# Patient Record
Sex: Female | Born: 1995
Health system: Southern US, Community
[De-identification: ages and names within clinical notes are randomized; demographics above are authoritative.]

---
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
## 2013-05-27 ENCOUNTER — Encounter (HOSPITAL_BASED_OUTPATIENT_CLINIC_OR_DEPARTMENT_OTHER): Payer: Self-pay | Admitting: Emergency Medicine

## 2013-05-27 DIAGNOSIS — J159 Unspecified bacterial pneumonia: Secondary | ICD-10-CM | POA: Insufficient documentation

## 2013-05-27 NOTE — ED Notes (Signed)
Pt c/o URI symptoms x 3 weeks 

## 2013-05-28 ENCOUNTER — Emergency Department (HOSPITAL_BASED_OUTPATIENT_CLINIC_OR_DEPARTMENT_OTHER): Payer: BC Managed Care – PPO

## 2013-05-28 ENCOUNTER — Emergency Department (HOSPITAL_BASED_OUTPATIENT_CLINIC_OR_DEPARTMENT_OTHER)
Admission: EM | Admit: 2013-05-28 | Discharge: 2013-05-28 | Disposition: A | Discharge status: Home / Self Care | Payer: BC Managed Care – PPO | Attending: Emergency Medicine | Admitting: Emergency Medicine

## 2013-05-28 DIAGNOSIS — J189 Pneumonia, unspecified organism: Secondary | ICD-10-CM

## 2013-05-28 MED ORDER — AMOXICILLIN-POT CLAVULANATE 875-125 MG PO TABS: 1.0000 | ORAL_TABLET | Freq: Two times a day (BID) | ORAL | Status: DC

## 2013-05-28 MED ORDER — ALBUTEROL SULFATE HFA 108 (90 BASE) MCG/ACT IN AERS: 1.0000 | INHALATION_SPRAY | Freq: Four times a day (QID) | RESPIRATORY_TRACT | Status: DC | PRN

## 2013-05-28 MED ORDER — GUAIFENESIN ER 600 MG PO TB12: 600.0000 mg | ORAL_TABLET | Freq: Two times a day (BID) | ORAL | Status: DC

## 2013-05-28 MED ORDER — AMOXICILLIN-POT CLAVULANATE 875-125 MG PO TABS
1.0000 | ORAL_TABLET | Freq: Once | ORAL | Status: AC
  Administered 2013-05-28: 1 via ORAL
  Filled 2013-05-28: qty 1

## 2013-05-28 MED ORDER — IBUPROFEN 800 MG PO TABS
800.0000 mg | ORAL_TABLET | Freq: Once | ORAL | Status: AC
  Administered 2013-05-28: 800 mg via ORAL
  Filled 2013-05-28: qty 1

## 2013-05-28 NOTE — ED Notes (Signed)
Runny nose x 3 weeks  Denies fever

## 2013-05-28 NOTE — ED Notes (Signed)
C/o cough,congestion runny nose  Fever up to 100

## 2013-05-28 NOTE — ED Provider Notes (Signed)
CSN: [id number]     Arrival date & time 05/27/13  2304 History   First MD Initiated Contact with Patient 05/28/13 0119     Chief Complaint  Patient presents with  . URI   (Consider location/radiation/quality/duration/timing/severity/associated sxs/prior Treatment) Patient is a 17 y.o. female presenting with URI. The history is provided by the patient.  URI Presenting symptoms: congestion and cough   Presenting symptoms: no fever and no sore throat   Congestion:    Location:  Chest   Interferes with sleep: no     Interferes with eating/drinking: no   Cough:    Cough characteristics:  Non-productive   Severity:  Moderate   Onset quality:  Gradual   Timing:  Intermittent   Progression:  Unchanged   Chronicity:  Recurrent Severity:  Mild Onset quality:  Gradual Timing:  Intermittent Progression:  Unchanged Chronicity:  Recurrent Relieved by:  Nothing Worsened by:  Nothing tried Ineffective treatments: zpak and inhaler which she forgot in [place]. Associated symptoms: no arthralgias, no myalgias and no neck pain   Risk factors: not elderly     History reviewed. No pertinent past medical history. History reviewed. No pertinent past surgical history. History reviewed. No pertinent family history. History  Substance Use Topics  . Smoking status: Never Smoker   . Smokeless tobacco: Not on file  . Alcohol Use: No   OB History   Grav Para Term Preterm Abortions TAB SAB Ect Mult Living                 Review of Systems  Constitutional: Negative for fever.  HENT: Positive for congestion. Negative for ear discharge, facial swelling, sore throat, trouble swallowing and voice change.   Respiratory: Positive for cough.   Musculoskeletal: Negative for arthralgias, myalgias and neck pain.  All other systems reviewed and are negative.    Allergies  Review of patient's allergies indicates not on file.  Home Medications  No current outpatient prescriptions on file. BP  121/77  Pulse 100  Temp(Src) 99.1 F (37.3 C) (Oral)  Resp 16  Ht 5\' 5"  (1.651 m)  Wt 150 lb (68.04 kg)  BMI 24.96 kg/m2  SpO2 100%  LMP 04/29/2013 Physical Exam  Constitutional: She is oriented to person, place, and time. She appears well-developed and well-nourished. No distress.  HENT:  Head: Normocephalic and atraumatic.  Mouth/Throat: Oropharynx is clear and moist.  Eyes: Conjunctivae are normal. Pupils are equal, round, and reactive to light.  Neck: Normal range of motion. Neck supple.  Cardiovascular: Normal rate, regular rhythm and intact distal pulses.   Pulmonary/Chest: Effort normal and breath sounds normal. No respiratory distress. She has no wheezes. She has no rales.  Abdominal: Soft. Bowel sounds are normal. There is no tenderness.  Musculoskeletal: Normal range of motion.  Neurological: She is alert and oriented to person, place, and time.  Skin: Skin is warm and dry.  Psychiatric: She has a normal mood and affect.    ED Course  Procedures (including critical care time) Labs Review Labs Reviewed - No data to display Imaging Review No results found.  EKG Interpretation   None       MDM  No diagnosis found. Will treat for pneumonia with augmentin.  Will write RX for inhaler as patient forgot hers.  And prescribe mucinex.  Tylenol and motrin for fever.  Follow up with your PMD for ongoing care.      Jasmine Awe, MD 05/28/13 [phone]

## 2020-05-21 ENCOUNTER — Other Ambulatory Visit (HOSPITAL_BASED_OUTPATIENT_CLINIC_OR_DEPARTMENT_OTHER): Payer: Self-pay | Admitting: Emergency Medicine

## 2020-05-21 ENCOUNTER — Encounter (HOSPITAL_BASED_OUTPATIENT_CLINIC_OR_DEPARTMENT_OTHER): Payer: Self-pay | Admitting: *Deleted

## 2020-05-21 ENCOUNTER — Other Ambulatory Visit: Payer: Self-pay

## 2020-05-21 ENCOUNTER — Emergency Department (HOSPITAL_BASED_OUTPATIENT_CLINIC_OR_DEPARTMENT_OTHER)
Admission: EM | Admit: 2020-05-21 | Discharge: 2020-05-21 | Disposition: A | Discharge status: Home / Self Care | Attending: Emergency Medicine | Admitting: Emergency Medicine

## 2020-05-21 ENCOUNTER — Emergency Department (HOSPITAL_BASED_OUTPATIENT_CLINIC_OR_DEPARTMENT_OTHER)

## 2020-05-21 DIAGNOSIS — Z20822 Contact with and (suspected) exposure to covid-19: Secondary | ICD-10-CM | POA: Insufficient documentation

## 2020-05-21 DIAGNOSIS — J209 Acute bronchitis, unspecified: Secondary | ICD-10-CM | POA: Diagnosis not present

## 2020-05-21 DIAGNOSIS — R0602 Shortness of breath: Secondary | ICD-10-CM | POA: Diagnosis present

## 2020-05-21 LAB — RESP PANEL BY RT-PCR (FLU A&B, COVID) ARPGX2
Influenza A by PCR: NEGATIVE
Influenza B by PCR: NEGATIVE
SARS Coronavirus 2 by RT PCR: NEGATIVE

## 2020-05-21 MED ORDER — AZITHROMYCIN 250 MG PO TABS: 250.0000 mg | ORAL_TABLET | Freq: Every day | ORAL | 0 refills | Status: DC

## 2020-05-21 MED ORDER — ALBUTEROL SULFATE HFA 108 (90 BASE) MCG/ACT IN AERS
2.0000 | INHALATION_SPRAY | Freq: Once | RESPIRATORY_TRACT | Status: AC
  Administered 2020-05-21: 10:00:00 2 via RESPIRATORY_TRACT
  Filled 2020-05-21: qty 6.7

## 2020-05-21 MED FILL — AZITHROMYCIN 250 MG TABLET: 250 | 5 days supply | Qty: 6 | Fill #0

## 2020-05-21 NOTE — ED Notes (Signed)
ED Provider at bedside. 

## 2020-05-21 NOTE — ED Provider Notes (Signed)
[facility] EMERGENCY DEPARTMENT Provider Note   CSN: [id number] Arrival date & time: 05/21/20  4401     History Chief Complaint  Patient presents with  . Chest Pain    Samantha Johnston is a 24 y.o. female.  She is here with a complaint of some tightness in her chest and shortness of breath along with a dry cough that started last night.  No fevers.  Nonproductive cough.  She thinks it is related to going outside in the cold old without a jacket.  No history of asthma.  Non-smoker.  She is Covid vaccinated.  She said she felt like this once before when she had pneumonia.  No leg swelling.  No recent travel or other PE risk factors.  Last menstrual period 3 weeks ago  The history is provided by the patient.  Chest Pain Pain location:  L chest and R chest Pain quality: tightness   Pain radiates to:  Does not radiate Pain severity:  Mild Onset quality:  Gradual Duration:  12 hours Progression:  Resolved Chronicity:  New Relieved by:  None tried Worsened by:  Nothing Ineffective treatments:  None tried Associated symptoms: cough and shortness of breath   Associated symptoms: no abdominal pain, no back pain, no diaphoresis, no fever, no headache, no lower extremity edema, no nausea and no vomiting   Risk factors: no prior DVT/PE and no smoking        History reviewed. No pertinent past medical history.  There are no problems to display for this patient.   History reviewed. No pertinent surgical history.   OB History    Gravida  2   Para  1   Term      Preterm      AB  1   Living        SAB      IAB      Ectopic      Multiple      Live Births              History reviewed. No pertinent family history.  Social History   Tobacco Use  . Smoking status: Never Smoker  . Smokeless tobacco: Never Used  Substance Use Topics  . Alcohol use: Yes    Comment: occasionally  . Drug use: No    Home Medications Prior to Admission medications    Not on File    Allergies    Patient has no known allergies.  Review of Systems   Review of Systems  Constitutional: Negative for diaphoresis and fever.  HENT: Negative for sore throat.   Eyes: Negative for visual disturbance.  Respiratory: Positive for cough and shortness of breath.   Cardiovascular: Positive for chest pain.  Gastrointestinal: Negative for abdominal pain, nausea and vomiting.  Genitourinary: Negative for dysuria.  Musculoskeletal: Negative for back pain.  Skin: Negative for rash.  Neurological: Negative for headaches.    Physical Exam Updated Vital Signs BP 124/74 (BP Location: Right Arm)   Pulse (!) 111   Temp 99.4 F (37.4 C) (Oral)   Resp (!) 21   Ht 5\' 3"  (1.6 m)   Wt 88 kg   LMP 04/17/2020   SpO2 99%   BMI 34.37 kg/m   Physical Exam Vitals and nursing note reviewed.  Constitutional:      General: She is not in acute distress.    Appearance: She is well-developed and well-nourished.  HENT:     Head: Normocephalic and atraumatic.  Eyes:     Conjunctiva/sclera: Conjunctivae normal.  Cardiovascular:     Rate and Rhythm: Normal rate and regular rhythm.     Heart sounds: Normal heart sounds. No murmur heard.   Pulmonary:     Effort: Pulmonary effort is normal. No respiratory distress.     Breath sounds: Normal breath sounds.  Abdominal:     Palpations: Abdomen is soft.     Tenderness: There is no abdominal tenderness.  Musculoskeletal:        General: No edema. Normal range of motion.     Cervical back: Neck supple.     Right lower leg: No tenderness. No edema.     Left lower leg: No tenderness. No edema.  Skin:    General: Skin is warm and dry.     Capillary Refill: Capillary refill takes less than 2 seconds.  Neurological:     General: No focal deficit present.     Mental Status: She is alert.  Psychiatric:        Mood and Affect: Mood and affect normal.     ED Results / Procedures / Treatments   Labs (all labs ordered are  listed, but only abnormal results are displayed) Labs Reviewed  RESP PANEL BY RT-PCR (FLU A&B, COVID) [id number]    EKG EKG Interpretation  Date/Time:  Monday May 21 2020 07:43:03 EST Ventricular Rate:  97 PR Interval:    QRS Duration: 107 QT Interval:  350 QTC Calculation: 445 R Axis:   62 Text Interpretation: Sinus rhythm Borderline Q waves in inferior leads Borderline T abnormalities, diffuse leads No old tracing to compare Confirmed by Meridee Score [phone]) on 05/21/2020 7:46:36 AM   Radiology DG Chest Port 1 View  Result Date: 05/21/2020 CLINICAL DATA:  Chest tightness and cough EXAM: PORTABLE CHEST 1 VIEW COMPARISON:  05/28/2017 FINDINGS: The heart size and mediastinal contours are within normal limits. Both lungs are clear. The visualized skeletal structures are unremarkable. IMPRESSION: No active disease. Electronically Signed   By: Judie Petit.  Shick M.D.   On: 05/21/2020 07:59    Procedures Procedures (including critical care time)  Medications Ordered in ED Medications  albuterol (VENTOLIN HFA) 108 (90 Base) MCG/ACT inhaler 2 puff (has no administration in time range)    ED Course  I have reviewed the triage vital signs and the nursing notes.  Pertinent labs & imaging results that were available during my care of the patient were reviewed by me and considered in my medical decision making (see chart for details).  Clinical Course as of 05/21/20 1700  Mon May 21, 2020  0809 Chest x-ray interpreted by me as no acute infiltrates or pneumothorax. [MB]    Clinical Course User Index [MB] Terrilee Files, MD   MDM Rules/Calculators/A&P                         40-year-old female here with shortness of breath and chest tightness.  Benign exam sating 99-100% on room air.  Low-grade fever.  EKG unremarkable.  Chest x-ray without acute infiltrates.  Albuterol with some improvement.  We will send a Covid test although doubt Covid.  Antibiotics for possible bronchitis.  Return  instructions discussed  Final Clinical Impression(s) / ED Diagnoses Final diagnoses:  Acute bronchitis, unspecified organism  Person under investigation for COVID-19    Rx / DC Orders ED Discharge Orders         Ordered    azithromycin (ZITHROMAX) 250 MG  tablet  Daily        05/21/20 0925           Terrilee Files, MD 05/21/20 1701

## 2020-05-21 NOTE — Discharge Instructions (Signed)
You were seen in the emergency department for cough and shortness of breath.  You had a chest x-ray and an EKG that did not show any significant abnormalities.  You were giving a breathing treatment with albuterol.  We are prescribing you antibiotics for possible bronchitis.  Please use the inhaler 2 puffs every 4-6 hours as needed.  You were also tested for Covid.  You will need to isolate until your Covid tests are resulted.  If you test positive you will need to isolate for up to 14 days.

## 2020-05-21 NOTE — ED Triage Notes (Signed)
Chest tightness started last night with coughing.

## 2020-05-26 ENCOUNTER — Other Ambulatory Visit: Payer: Self-pay

## 2020-05-26 ENCOUNTER — Encounter (HOSPITAL_BASED_OUTPATIENT_CLINIC_OR_DEPARTMENT_OTHER): Payer: Self-pay | Admitting: Emergency Medicine

## 2020-05-26 DIAGNOSIS — H6123 Impacted cerumen, bilateral: Secondary | ICD-10-CM | POA: Insufficient documentation

## 2020-05-26 DIAGNOSIS — R0981 Nasal congestion: Secondary | ICD-10-CM | POA: Diagnosis not present

## 2020-05-26 DIAGNOSIS — H9202 Otalgia, left ear: Secondary | ICD-10-CM | POA: Diagnosis present

## 2020-05-26 DIAGNOSIS — H7292 Unspecified perforation of tympanic membrane, left ear: Secondary | ICD-10-CM | POA: Insufficient documentation

## 2020-05-26 NOTE — ED Triage Notes (Signed)
L ear pain since this morning. Recent URI. Denies other sx.

## 2020-05-27 ENCOUNTER — Emergency Department (HOSPITAL_BASED_OUTPATIENT_CLINIC_OR_DEPARTMENT_OTHER)
Admission: EM | Admit: 2020-05-27 | Discharge: 2020-05-27 | Disposition: A | Discharge status: Home / Self Care | Attending: Emergency Medicine | Admitting: Emergency Medicine

## 2020-05-27 DIAGNOSIS — H7292 Unspecified perforation of tympanic membrane, left ear: Secondary | ICD-10-CM

## 2020-05-27 DIAGNOSIS — H6123 Impacted cerumen, bilateral: Secondary | ICD-10-CM

## 2020-05-27 MED ORDER — CIPROFLOXACIN-DEXAMETHASONE 0.3-0.1 % OT SUSP: 4.0000 [drp] | Freq: Two times a day (BID) | OTIC | 0 refills | Status: DC

## 2020-05-27 MED ORDER — IBUPROFEN 600 MG PO TABS: 600.0000 mg | ORAL_TABLET | Freq: Four times a day (QID) | ORAL | 0 refills | Status: DC | PRN

## 2020-05-27 NOTE — Discharge Instructions (Addendum)
You were seen today for left ear pain.  You had cerumen impaction and a perforated left tympanic membrane.  Use drops as directed.  Do not submerge your ear in water including swimming.  Follow-up with the ENT.

## 2020-05-27 NOTE — ED Provider Notes (Signed)
[facility] EMERGENCY DEPARTMENT Provider Note   CSN: [id number] Arrival date & time: 05/26/20  2303     History Chief Complaint  Patient presents with  . Ear Pain    Samantha Johnston is a 24 y.o. female.  HPI     This is a 24 year old female who presents with left ear pain.  Patient reports she has had some nasal congestion and sinus pressure over the last several days.  She reports that she helped her nose to "pop her ears" yesterday.  Following that she had acute onset of left ear pain.  Pain subsided.  However, this morning she woke up and had some decreased hearing in the bilateral ears.  She did the same thing and had worsening of pain.  She also reports tinnitus in the left ear.  She states she has taken Aleve with minimal relief.  She has not noted any drainage.  She rates her pain at 7 out of 10.  History reviewed. No pertinent past medical history.  There are no problems to display for this patient.   History reviewed. No pertinent surgical history.   OB History    Gravida  2   Para  1   Term      Preterm      AB  1   Living        SAB      IAB      Ectopic      Multiple      Live Births              No family history on file.  Social History   Tobacco Use  . Smoking status: Never Smoker  . Smokeless tobacco: Never Used  Substance Use Topics  . Alcohol use: Yes    Comment: occasionally  . Drug use: No    Home Medications Prior to Admission medications   Medication Sig Start Date End Date Taking? Authorizing Provider  azithromycin (ZITHROMAX) 250 MG tablet Take 1 tablet (250 mg total) by mouth daily. Take first 2 tablets together, then 1 every day until finished. 05/21/20   Terrilee Files, MD  ciprofloxacin-dexamethasone (CIPRODEX) OTIC suspension Place 4 drops into the left ear 2 (two) times daily. 05/27/20   Luverne Zerkle, Mayer Masker, MD  ibuprofen (ADVIL) 600 MG tablet Take 1 tablet (600 mg total) by mouth every 6 (six)  hours as needed. 05/27/20   Calahan Pak, Mayer Masker, MD    Allergies    Patient has no known allergies.  Review of Systems   Review of Systems  Constitutional: Negative for fever.  HENT: Positive for ear pain and sinus pressure. Negative for ear discharge and facial swelling.   All other systems reviewed and are negative.   Physical Exam Updated Vital Signs BP 122/68 (BP Location: Left Arm)   Pulse (!) 103   Temp 98.3 F (36.8 C) (Oral)   Resp 20   Ht 1.6 m (5\' 3" )   Wt 88 kg   LMP 05/22/2020   SpO2 97%   BMI 34.37 kg/m   Physical Exam Vitals and nursing note reviewed.  Constitutional:      Appearance: She is well-developed and well-nourished. She is not ill-appearing.  HENT:     Head: Normocephalic and atraumatic.     Ears:     Comments: Initial exam with bilateral cerumen impaction obstructing TMs completely Irrigation of the left external auditory canal with evacuation of a large ball of cerumen.  Repeat exam  with hemotympanum noted.  Unable to view perforation but suspect perforation given exam    Nose: Congestion present.     Mouth/Throat:     Mouth: Mucous membranes are moist.  Eyes:     Pupils: Pupils are equal, round, and reactive to light.  Cardiovascular:     Rate and Rhythm: Normal rate and regular rhythm.  Pulmonary:     Effort: Pulmonary effort is normal. No respiratory distress.  Musculoskeletal:     Cervical back: Neck supple.  Skin:    General: Skin is warm and dry.  Neurological:     Mental Status: She is alert and oriented to person, place, and time.  Psychiatric:        Mood and Affect: Mood and affect and mood normal.     ED Results / Procedures / Treatments   Labs (all labs ordered are listed, but only abnormal results are displayed) Labs Reviewed - No data to display  EKG None  Radiology No results found.  Procedures .Ear Cerumen Removal  Date/Time: 05/27/2020 1:23 AM Performed by: Shon Baton, MD Authorized by: Shon Baton, MD   Consent:    Consent obtained:  Verbal   Consent given by:  Patient   Risks discussed:  Bleeding, infection, TM perforation and incomplete removal   Alternatives discussed:  No treatment Universal protocol:    Patient identity confirmed:  Verbally with patient Procedure details:    Location:  L ear and R ear   Procedure type: irrigation     Successful cerumen removal: Cerumen removed left ear, deferred right ear.   Post-procedure details:    Inspection:  Bleeding   Hearing quality:  Improved   Procedure completion:  Tolerated Comments:     Hemotympanum noted left ear, presumed perforation   (including critical care time)  Medications Ordered in ED Medications - No data to display  ED Course  I have reviewed the triage vital signs and the nursing notes.  Pertinent labs & imaging results that were available during my care of the patient were reviewed by me and considered in my medical decision making (see chart for details).    MDM Rules/Calculators/A&P                          Patient presents with pain and tinnitus left ear.  Reports popping her ears secondary to sinus pressure.  She is overall nontoxic.  Initial exam with bilateral impaction.  Unable to visualize TM at all.  Patient was gently irrigated as I do not have access to direct visualization for manual extraction.  Did extract a large amount of earwax from the left ear.  She has hemotympanum and while did not see a large perforation, presumed perforation given symptoms.  She will be started on Ciprodex drops.  Was given precautions and instructed not to submerge.  Suspect perforation likely prior to irrigation given symptoms upon arrival.  Patient deferred further irrigation to the right ear.  Will give ENT follow-up.  After history, exam, and medical workup I feel the patient has been appropriately medically screened and is safe for discharge home. Pertinent diagnoses were discussed with the patient.  Patient was given return precautions.  Final Clinical Impression(s) / ED Diagnoses Final diagnoses:  Perforation of left tympanic membrane  Bilateral impacted cerumen    Rx / DC Orders ED Discharge Orders         Ordered    ciprofloxacin-dexamethasone (CIPRODEX) OTIC  suspension  2 times daily        05/27/20 0120    ibuprofen (ADVIL) 600 MG tablet  Every 6 hours PRN        05/27/20 0120           Shon Baton, MD 05/27/20 [phone]

## 2021-10-19 IMAGING — DX DG CHEST 1V PORT
1 series · 1 of 1 positions shown · non-contrast
Comparison: 05/28/2017

CLINICAL DATA: Chest tightness and cough

EXAM:
PORTABLE CHEST 1 VIEW

[chest ap]
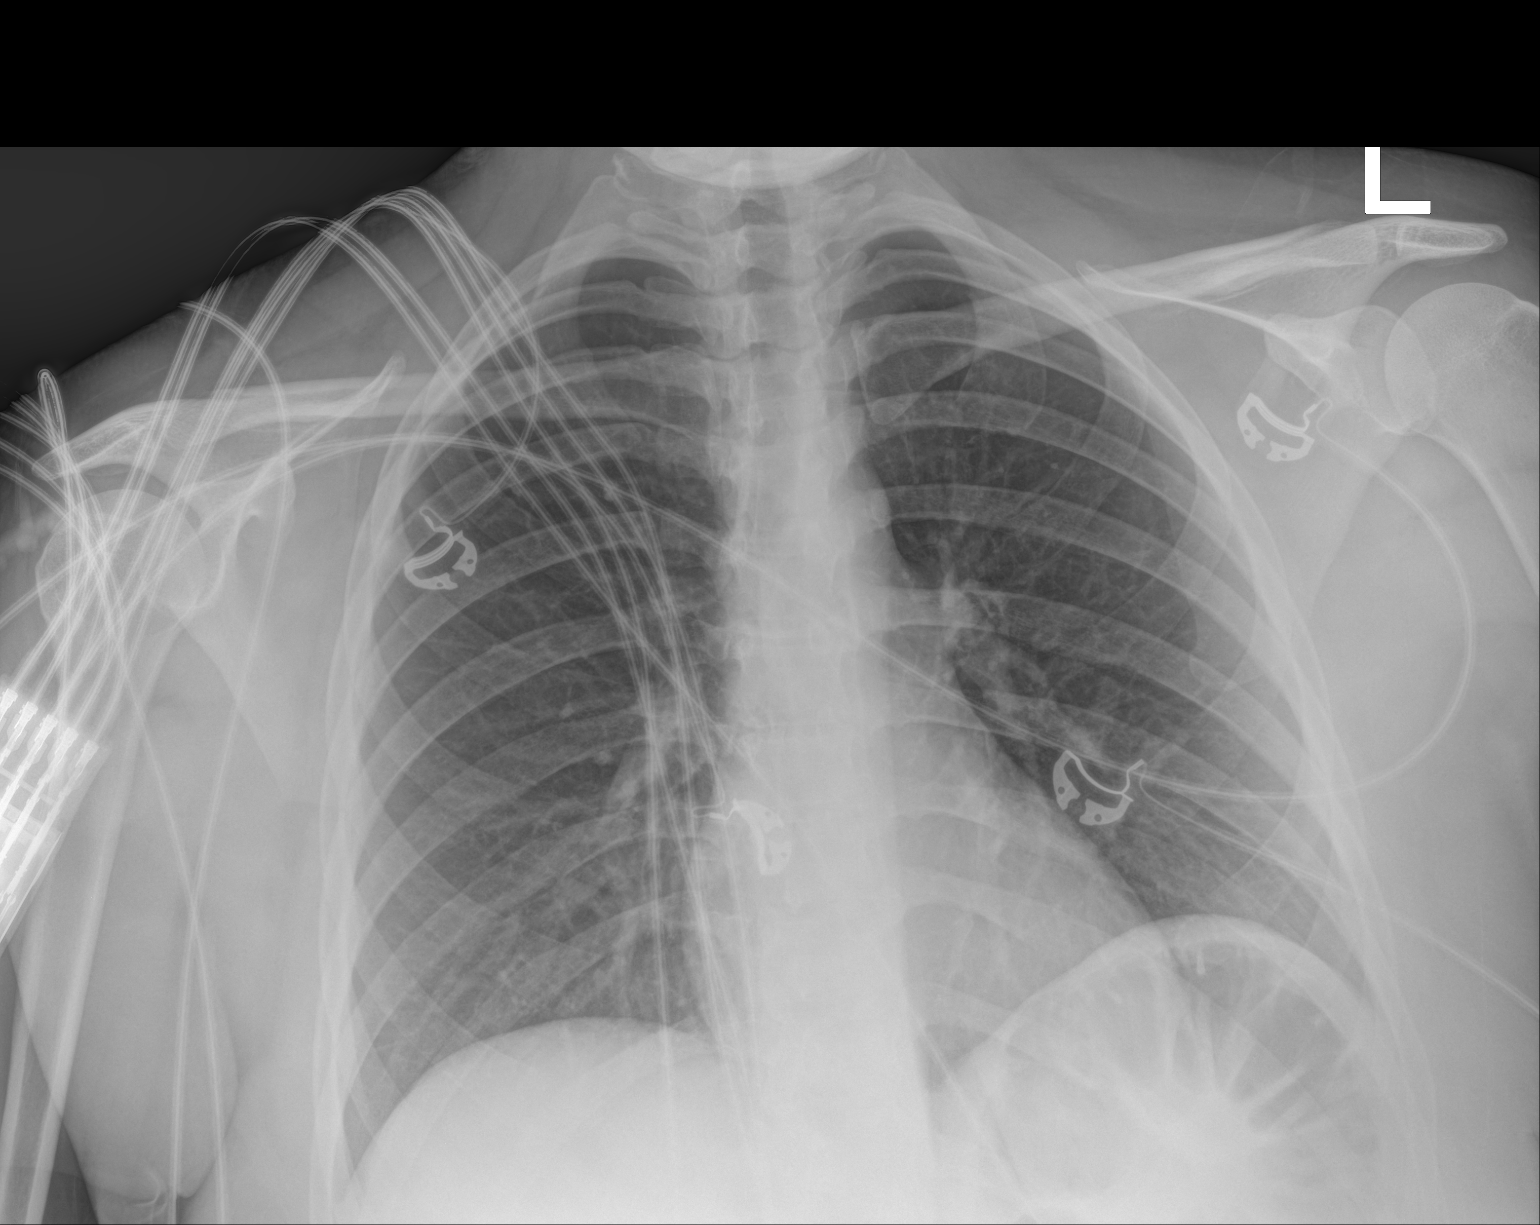

[1 of 1 positions shown; findings below may reference images not displayed]

FINDINGS: The heart size and mediastinal contours are within normal limits.
Both lungs are clear. The visualized skeletal structures are
unremarkable.
IMPRESSION: No active disease.

## 2022-10-07 ENCOUNTER — Emergency Department (HOSPITAL_BASED_OUTPATIENT_CLINIC_OR_DEPARTMENT_OTHER): Payer: Managed Care, Other (non HMO)

## 2022-10-07 ENCOUNTER — Encounter (HOSPITAL_BASED_OUTPATIENT_CLINIC_OR_DEPARTMENT_OTHER): Payer: Self-pay | Admitting: Emergency Medicine

## 2022-10-07 ENCOUNTER — Emergency Department (HOSPITAL_BASED_OUTPATIENT_CLINIC_OR_DEPARTMENT_OTHER)
Admission: EM | Admit: 2022-10-07 | Discharge: 2022-10-07 | Disposition: A | Discharge status: Home / Self Care | Payer: Managed Care, Other (non HMO) | Attending: Emergency Medicine | Admitting: Emergency Medicine

## 2022-10-07 ENCOUNTER — Other Ambulatory Visit: Payer: Self-pay

## 2022-10-07 DIAGNOSIS — O26891 Other specified pregnancy related conditions, first trimester: Secondary | ICD-10-CM | POA: Insufficient documentation

## 2022-10-07 DIAGNOSIS — R1013 Epigastric pain: Secondary | ICD-10-CM | POA: Diagnosis not present

## 2022-10-07 DIAGNOSIS — O3680X0 Pregnancy with inconclusive fetal viability, not applicable or unspecified: Secondary | ICD-10-CM

## 2022-10-07 LAB — CBC WITH DIFFERENTIAL/PLATELET
Abs Immature Granulocytes: 0.03 10*3/uL (ref 0.00–0.07)
Basophils Absolute: 0.1 10*3/uL (ref 0.0–0.1)
Basophils Relative: 1 %
Eosinophils Absolute: 0.8 10*3/uL — ABNORMAL HIGH (ref 0.0–0.5)
Eosinophils Relative: 7 %
HCT: 38.9 % (ref 36.0–46.0)
Hemoglobin: 13.5 g/dL (ref 12.0–15.0)
Immature Granulocytes: 0 %
Lymphocytes Relative: 21 %
Lymphs Abs: 2.5 10*3/uL (ref 0.7–4.0)
MCH: 30.5 pg (ref 26.0–34.0)
MCHC: 34.7 g/dL (ref 30.0–36.0)
MCV: 88 fL (ref 80.0–100.0)
Monocytes Absolute: 1 10*3/uL (ref 0.1–1.0)
Monocytes Relative: 9 %
Neutro Abs: 7.3 10*3/uL (ref 1.7–7.7)
Neutrophils Relative %: 62 %
Platelets: 379 10*3/uL (ref 150–400)
RBC: 4.42 MIL/uL (ref 3.87–5.11)
RDW: 12.6 % (ref 11.5–15.5)
WBC: 11.8 10*3/uL — ABNORMAL HIGH (ref 4.0–10.5)
nRBC: 0 % (ref 0.0–0.2)

## 2022-10-07 LAB — URINALYSIS, W/ REFLEX TO CULTURE (INFECTION SUSPECTED)
Bilirubin Urine: NEGATIVE
Glucose, UA: NEGATIVE mg/dL
Hgb urine dipstick: NEGATIVE
Ketones, ur: NEGATIVE mg/dL
Leukocytes,Ua: NEGATIVE
Nitrite: NEGATIVE
Protein, ur: NEGATIVE mg/dL
Specific Gravity, Urine: >=1.03 (ref 1.005–1.030)
pH: 6 (ref 5.0–8.0)

## 2022-10-07 LAB — COMPREHENSIVE METABOLIC PANEL
ALT: 19 U/L (ref 0–44)
AST: 17 U/L (ref 15–41)
Albumin: 3.8 g/dL (ref 3.5–5.0)
Alkaline Phosphatase: 55 U/L (ref 38–126)
Anion gap: 8 (ref 5–15)
BUN: 16 mg/dL (ref 6–20)
CO2: 25 mmol/L (ref 22–32)
Calcium: 9 mg/dL (ref 8.9–10.3)
Chloride: 104 mmol/L (ref 98–111)
Creatinine, Ser: 1 mg/dL (ref 0.44–1.00)
GFR, Estimated: >60 mL/min (ref >60)
Glucose, Bld: 116 mg/dL — ABNORMAL HIGH (ref 70–99)
Potassium: 4.2 mmol/L (ref 3.5–5.1)
Sodium: 137 mmol/L (ref 135–145)
Total Bilirubin: 0.5 mg/dL (ref 0.3–1.2)
Total Protein: 7.1 g/dL (ref 6.5–8.1)

## 2022-10-07 LAB — LIPASE, BLOOD: Lipase: 59 U/L — ABNORMAL HIGH (ref 11–51)

## 2022-10-07 LAB — HCG, QUANTITATIVE, PREGNANCY: hCG, Beta Chain, Quant, S: 99 m[IU]/mL — ABNORMAL HIGH (ref <5)

## 2022-10-07 NOTE — ED Provider Notes (Signed)
[place] EMERGENCY DEPARTMENT AT [facility] Provider Note   CSN: [id number] Arrival date & time: 10/07/22  1757     History {Add pertinent medical, surgical, social history, OB history to HPI:1} Chief Complaint  Patient presents with   Abdominal Pain    Samantha Johnston is a 27 y.o. female.  HPI     [id number]    Injections for weight loss for one month, then stopped it Has regular menses, missed period  Today around 430 PM developed abdominal pain center of abdomen radiating to epigastrium. No nausea, vomiting, diarrhea, fevers Laying down makes it better if try to get up, bend, touch thea rea is worse  No urinary symptoms No vaginal bleeding or discharge  3/27  No hx of surgeyr on abdomen, no pain after eating  History reviewed. No pertinent past medical history.   Home Medications Prior to Admission medications   Medication Sig Start Date End Date Taking? Authorizing Provider  ciprofloxacin-dexamethasone (CIPRODEX) OTIC suspension Place 4 drops into the left ear 2 (two) times daily. 05/27/20   Horton, Mayer Masker, MD  ibuprofen (ADVIL) 600 MG tablet Take 1 tablet (600 mg total) by mouth every 6 (six) hours as needed. 05/27/20   Horton, Mayer Masker, MD      Allergies    Patient has no known allergies.    Review of Systems   Review of Systems  Physical Exam Updated Vital Signs BP 118/63   Pulse 91   Temp 98.5 F (36.9 C) (Oral)   Resp 20   Ht 5\' 4"  (1.626 m)   Wt 94.3 kg   LMP 08/27/2022   SpO2 100%   BMI 35.70 kg/m  Physical Exam Vitals and nursing note reviewed.  Constitutional:      General: She is not in acute distress.    Appearance: She is well-developed. She is not diaphoretic.  HENT:     Head: Normocephalic and atraumatic.  Eyes:     Conjunctiva/sclera: Conjunctivae normal.  Cardiovascular:     Rate and Rhythm: Normal rate and regular rhythm.     Heart sounds: Normal heart sounds. No murmur heard.    No friction rub. No  gallop.  Pulmonary:     Effort: Pulmonary effort is normal. No respiratory distress.     Breath sounds: Normal breath sounds. No wheezing or rales.  Abdominal:     General: There is no distension.     Palpations: Abdomen is soft.     Tenderness: There is no abdominal tenderness. There is no guarding.  Musculoskeletal:        General: No tenderness.     Cervical back: Normal range of motion.  Skin:    General: Skin is warm and dry.     Findings: No erythema or rash.  Neurological:     Mental Status: She is alert and oriented to person, place, and time.     ED Results / Procedures / Treatments   Labs (all labs ordered are listed, but only abnormal results are displayed) Labs Reviewed  CBC WITH DIFFERENTIAL/PLATELET - Abnormal; Notable for the following components:      Result Value   WBC 11.8 (*)    Eosinophils Absolute 0.8 (*)    All other components within normal limits  COMPREHENSIVE METABOLIC PANEL - Abnormal; Notable for the following components:   Glucose, Bld 116 (*)    All other components within normal limits  HCG, QUANTITATIVE, PREGNANCY - Abnormal; Notable for the following components:  hCG, Beta Chain, Quant, S 99 (*)    All other components within normal limits  URINALYSIS, W/ REFLEX TO CULTURE (INFECTION SUSPECTED) - Abnormal; Notable for the following components:   Bacteria, UA RARE (*)    All other components within normal limits    EKG None  Radiology US OB LESS THAN 14 WEEKS WITH OB TRANSVAGINAL  Result Date: 10/07/2022 CLINICAL DATA:  Pelvic pain, pregnant.  Beta HCG 99.  LMP 08/27/2022 EXAM: OBSTETRIC <14 WK [place] AND TRANSVAGINAL OB US TECHNIQUE: Both transabdominal and transvaginal ultrasound examinations were performed for complete evaluation of the gestation as well as the maternal uterus, adnexal regions, and pelvic cul-de-sac. Transvaginal technique was performed to assess early pregnancy. COMPARISON:  None Available. FINDINGS: Intrauterine  gestational sac: None Yolk sac:  Not Visualized. Embryo:  Not Visualized. Cardiac Activity: Not Visualized. Heart Rate: Not applicable bpm MSD: Not applicable Subchorionic hemorrhage:  Not applicable Maternal uterus/adnexae: Unremarkable uterus and adnexa. Corpus luteum cyst measuring 2.0 cm in the right ovary. Endometrial thickness of 19 mm. Nabothian cysts in the cervix. Small volume anechoic free fluid in the pelvis. IMPRESSION: 1. No intrauterine gestation visualized. In the setting of positive pregnancy test and no definite intrauterine pregnancy, this reflects a pregnancy of unknown location. Differential considerations include early normal IUP, miscarriage, or nonvisualized ectopic pregnancy. Differentiation is achieved with serial beta HCG supplemented by repeat sonography as clinically warranted. Electronically Signed   By: Minerva Fester M.D.   On: 10/07/2022 20:02    Procedures Procedures  {Document cardiac monitor, telemetry assessment procedure when appropriate:1}  Medications Ordered in ED Medications - No data to display  ED Course/ Medical Decision Making/ A&P   {   Click here for ABCD2, HEART and other calculatorsREFRESH Note before signing :1}                          Medical Decision Making Amount and/or Complexity of Data Reviewed Labs: ordered. Radiology: ordered.   ***  {Document critical care time when appropriate:1} {Document review of labs and clinical decision tools ie heart score, Chads2Vasc2 etc:1}  {Document your independent review of radiology images, and any outside records:1} {Document your discussion with family members, caretakers, and with consultants:1} {Document social determinants of health affecting pt's care:1} {Document your decision making why or why not admission, treatments were needed:1} Final Clinical Impression(s) / ED Diagnoses Final diagnoses:  None    Rx / DC Orders ED Discharge Orders     None

## 2022-10-07 NOTE — ED Notes (Signed)
Lab to add-on lipase  

## 2022-10-07 NOTE — ED Triage Notes (Signed)
Patient arrives ambulatory by POV c/o mid abdominal pain onset of about an hour ago. Denies any N/V/D. Patient reports + pregnancy test 2 days ago.
